# Patient Record
Sex: Male | Born: 1950 | Race: White | Hispanic: No | Marital: Married | Smoking: Former smoker
Health system: Southern US, Community
[De-identification: ages and names within clinical notes are randomized; demographics above are authoritative.]

## PROBLEM LIST (undated history)

## (undated) DIAGNOSIS — K219 Gastro-esophageal reflux disease without esophagitis: Secondary | ICD-10-CM

## (undated) DIAGNOSIS — I2699 Other pulmonary embolism without acute cor pulmonale: Secondary | ICD-10-CM

## (undated) DIAGNOSIS — I10 Essential (primary) hypertension: Secondary | ICD-10-CM

## (undated) DIAGNOSIS — N189 Chronic kidney disease, unspecified: Secondary | ICD-10-CM

## (undated) DIAGNOSIS — E785 Hyperlipidemia, unspecified: Secondary | ICD-10-CM

## (undated) DIAGNOSIS — R51 Headache: Secondary | ICD-10-CM

## (undated) DIAGNOSIS — G8929 Other chronic pain: Secondary | ICD-10-CM

## (undated) DIAGNOSIS — G43909 Migraine, unspecified, not intractable, without status migrainosus: Secondary | ICD-10-CM

## (undated) DIAGNOSIS — B019 Varicella without complication: Secondary | ICD-10-CM

## (undated) DIAGNOSIS — M542 Cervicalgia: Secondary | ICD-10-CM

## (undated) DIAGNOSIS — T7840XA Allergy, unspecified, initial encounter: Secondary | ICD-10-CM

## (undated) HISTORY — DX: Headache: R51

## (undated) HISTORY — DX: Essential (primary) hypertension: I10

## (undated) HISTORY — DX: Gastro-esophageal reflux disease without esophagitis: K21.9

## (undated) HISTORY — DX: Other chronic pain: G89.29

## (undated) HISTORY — DX: Allergy, unspecified, initial encounter: T78.40XA

## (undated) HISTORY — DX: Chronic kidney disease, unspecified: N18.9

## (undated) HISTORY — DX: Migraine, unspecified, not intractable, without status migrainosus: G43.909

## (undated) HISTORY — DX: Cervicalgia: M54.2

## (undated) HISTORY — DX: Varicella without complication: B01.9

## (undated) HISTORY — DX: Other pulmonary embolism without acute cor pulmonale: I26.99

## (undated) HISTORY — DX: Hyperlipidemia, unspecified: E78.5

## (undated) HISTORY — PX: OTHER SURGICAL HISTORY: SHX169

---

## 1980-04-24 HISTORY — PX: SPINE SURGERY: SHX786

## 2008-04-24 HISTORY — PX: CARDIAC CATHETERIZATION: SHX172

## 2008-11-24 ENCOUNTER — Observation Stay (HOSPITAL_COMMUNITY): Admission: EM | Admit: 2008-11-24 | Discharge: 2008-11-26 | Payer: Self-pay | Admitting: Emergency Medicine

## 2009-04-24 DIAGNOSIS — I2699 Other pulmonary embolism without acute cor pulmonale: Secondary | ICD-10-CM

## 2009-04-24 HISTORY — DX: Other pulmonary embolism without acute cor pulmonale: I26.99

## 2009-08-17 ENCOUNTER — Emergency Department (HOSPITAL_COMMUNITY): Admission: EM | Admit: 2009-08-17 | Discharge: 2009-08-17 | Payer: Self-pay | Admitting: Emergency Medicine

## 2009-08-18 ENCOUNTER — Ambulatory Visit: Payer: Self-pay | Admitting: Internal Medicine

## 2009-08-18 ENCOUNTER — Telehealth: Payer: Self-pay | Admitting: Internal Medicine

## 2009-08-18 DIAGNOSIS — K219 Gastro-esophageal reflux disease without esophagitis: Secondary | ICD-10-CM

## 2009-08-18 DIAGNOSIS — R079 Chest pain, unspecified: Secondary | ICD-10-CM

## 2009-08-19 ENCOUNTER — Encounter: Payer: Self-pay | Admitting: Internal Medicine

## 2009-08-20 ENCOUNTER — Ambulatory Visit: Payer: Self-pay | Admitting: Vascular Surgery

## 2009-08-20 ENCOUNTER — Observation Stay (HOSPITAL_COMMUNITY): Admission: EM | Admit: 2009-08-20 | Discharge: 2009-08-22 | Payer: Self-pay | Admitting: Emergency Medicine

## 2009-08-20 ENCOUNTER — Ambulatory Visit: Payer: Self-pay | Admitting: Critical Care Medicine

## 2009-08-20 ENCOUNTER — Telehealth (INDEPENDENT_AMBULATORY_CARE_PROVIDER_SITE_OTHER): Payer: Self-pay | Admitting: *Deleted

## 2009-08-20 ENCOUNTER — Encounter: Admission: RE | Admit: 2009-08-20 | Discharge: 2009-08-20 | Payer: Self-pay | Admitting: Internal Medicine

## 2009-08-23 ENCOUNTER — Encounter (INDEPENDENT_AMBULATORY_CARE_PROVIDER_SITE_OTHER): Payer: Self-pay | Admitting: *Deleted

## 2009-08-23 ENCOUNTER — Encounter: Admission: RE | Admit: 2009-08-23 | Discharge: 2009-08-23 | Payer: Self-pay | Admitting: Internal Medicine

## 2009-09-06 ENCOUNTER — Telehealth: Payer: Self-pay | Admitting: Internal Medicine

## 2009-09-17 ENCOUNTER — Telehealth (INDEPENDENT_AMBULATORY_CARE_PROVIDER_SITE_OTHER): Payer: Self-pay | Admitting: *Deleted

## 2009-10-19 ENCOUNTER — Telehealth: Payer: Self-pay | Admitting: Internal Medicine

## 2010-05-15 ENCOUNTER — Encounter: Payer: Self-pay | Admitting: Internal Medicine

## 2010-05-16 ENCOUNTER — Encounter
Admission: RE | Admit: 2010-05-16 | Discharge: 2010-05-16 | Payer: Self-pay | Source: Home / Self Care | Attending: Cardiovascular Disease | Admitting: Cardiovascular Disease

## 2010-05-24 NOTE — Letter (Signed)
Summary: [facility] Instructions  [facility]  [address] [place], [place] [address]   Phone: [phone]  Fax: [phone]       Timothy George    1950/07/06    MRN: [id number]        Procedure Day /Date:TUESDAY 09/21/09     Arrival Time:1:00 PM     Procedure Time:2:00 PM     Location of Procedure:                    X  [facility] (4th Floor)                        PREPARATION FOR COLONOSCOPY WITH MOVIPREP/ENDO   Starting 5 days prior to your procedure 05-26-2011do not eat nuts, seeds, popcorn, corn, beans, peas,  salads, or any raw vegetables.  Do not take any fiber supplements (e.g. Metamucil, Citrucel, and Benefiber).  THE DAY BEFORE YOUR PROCEDURE         DATE: 09-20-2009  DAY: Monday  1.  Drink clear liquids the entire day-NO SOLID FOOD  2.  Do not drink anything colored red or purple.  Avoid juices with pulp.  No orange juice.  3.  Drink at least 64 oz. (8 glasses) of fluid/clear liquids during the day to prevent dehydration and help the prep work efficiently.  CLEAR LIQUIDS INCLUDE: Water Jello Ice Popsicles Tea (sugar ok, no milk/cream) Powdered fruit flavored drinks Coffee (sugar ok, no milk/cream) Gatorade Juice: apple, white grape, white cranberry  Lemonade Clear bullion, consomm, broth Carbonated beverages (any kind) Strained chicken noodle soup Hard Candy                             4.  In the morning, mix first dose of MoviPrep solution:    Empty 1 Pouch A and 1 Pouch B into the disposable container    Add lukewarm drinking water to the top line of the container. Mix to dissolve    Refrigerate (mixed solution should be used within 24 hrs)  5.  Begin drinking the prep at 5:00 p.m. The MoviPrep container is divided by 4 marks.   Every 15 minutes drink the solution down to the next mark (approximately 8 oz) until the full liter is complete.   6.  Follow completed prep with 16 oz of clear liquid of your choice  (Nothing red or purple).  Continue to drink clear liquids until bedtime.  7.  Before going to bed, mix second dose of MoviPrep solution:    Empty 1 Pouch A and 1 Pouch B into the disposable container    Add lukewarm drinking water to the top line of the container. Mix to dissolve    Refrigerate  THE DAY OF YOUR PROCEDURE      DATE: 5/31/11DAY:TUESDAY  Beginning at 9:00 a.m. (5 hours before procedure):         1. Every 15 minutes, drink the solution down to the next mark (approx 8 oz) until the full liter is complete.  2. Follow completed prep with 16 oz. of clear liquid of your choice.    3. You may drink clear liquids until 12 NOON(2 HOURS BEFORE PROCEDURE).   MEDICATION INSTRUCTIONS  Unless otherwise instructed, you should take regular prescription medications with a small sip of water   as early as possible the morning of your procedure.  OTHER INSTRUCTIONS  You will need a responsible adult at least 59 years of age to accompany you and drive you home.   This person must remain in the waiting room during your procedure.  Wear loose fitting clothing that is easily removed.  Leave jewelry and other valuables at home.  However, you may wish to bring a book to read or  an iPod/MP3 player to listen to music as you wait for your procedure to start.  Remove all body piercing jewelry and leave at home.  Total time from sign-in until discharge is approximately 2-3 hours.  You should go home directly after your procedure and rest.  You can resume normal activities the  day after your procedure.  The day of your procedure you should not:   Drive   Make legal decisions   Operate machinery   Drink alcohol   Return to work  You will receive specific instructions about eating, activities and medications before you leave.    The above instructions have been reviewed and explained to me by   _______________________    I fully understand and can verbalize  these instructions _____________________________ Date _________

## 2010-05-24 NOTE — Miscellaneous (Signed)
Summary: discharge summary    NAME:  Timothy George, Timothy George            ACCOUNT NO.:  [id number]      MEDICAL RECORD NO.:  [id number]          PATIENT TYPE:  INP      LOCATION:  3703                         FACILITY:  [facility]      PHYSICIAN:  Ricki Rodriguez, M.D.  DATE OF BIRTH:  1951-03-08      DATE OF ADMISSION:  08/20/2009   DATE OF DISCHARGE:  08/22/2009                                  DISCHARGE SUMMARY      FINAL DIAGNOSES:   1. Small pulmonary embolism.   2. Chest pain.   3. Anxiety.   4. Hypertension.   5. Gastroesophageal reflux disease.      DISCHARGE MEDICATIONS:   1. Enoxaparin 80 mg subcutaneously twice daily.   2. Nitroglycerin 0.4 mg tablet 1 sublingual every 5 minutes x3 as       needed for chest pain.   3. Potassium chloride 10 mEq daily.   4. Warfarin 5 mg tablet 1-1/2 tablet daily in the evening.   5. Lisinopril/hydrochlorothiazide 20/12.5 mg 1 daily.   6. Metoprolol tartrate 50 mg daily.   7. Multivitamin daily.   8. Nexium 40 mg daily.   9. Xanax 0.25 mg 1 daily.      DISCHARGE DIET:  Low-sodium, heart-healthy diet.  The patient is to   avoid green leafy vegetables including broccoli and brussels sprouts.      DISCHARGE ACTIVITY:  The patient is to increase activity as tolerated.      FOLLOWUP:  By Dr. Orpah Cobb in 4 days and get outpatient PT/INR in 4   days.      CONDITION ON DISCHARGE:  Improved.      HISTORY:  This 60 year old white male presented with chest pain.  He had   undergone a CT of the chest earlier in the day showing small pulmonary   embolism.  His recent cardiac workup was unremarkable.      PHYSICAL EXAMINATION:  GENERAL:  The patient is averagely built and   nourished white male in no acute distress.   VITAL SIGNS:  Temperature 97.9, pulse 78, respirations 18, blood   pressure 134/83, and oxygen saturation 95% on room air.   HEENT:  The patient is normocephalic and atraumatic with blue eyes.   Conjunctivae pink.  Sclerae  nonicteric.   NECK:  No JVD.   LUNGS:  Clear bilaterally.   HEART:  Normal S1 and S2.   ABDOMEN:  Soft and nontender.   EXTREMITIES:  No edema, cyanosis, or clubbing.   CNS:  The patient moves all 4 extremities.      LABORATORY DATA:  Normal hemoglobin, hematocrit, WBC count, and platelet   count.  Normal electrolytes, BUN, and creatinine.  Normal CK-MB and   troponin I.  INR 1.1.      HOSPITAL COURSE:  The patient was placed in observation.  He was started   on Lovenox injection and on Coumadin.  The patient's wife had experience   in giving injections and the patient chose to have outpatient treatment,   hence he was  discharged home in satisfactory condition with Lovenox   injection prescription and Coumadin prescription.  His INR will be   checked on outpatient basis at our office and he will continue Coumadin   for 3-6 months with INR goal of 2.5-3.5.  His lower extremity Doppler   was negative for DVT.               Ricki Rodriguez, M.D.            ASK/MEDQ  D:  08/22/2009  T:  08/23/2009  Job:  [id number]      Electronically Signed by Orpah Cobb M.D. on 08/30/2009 08:46:33 AM    NAME:  Timothy George, Timothy George            ACCOUNT NO.:  [id number]      MEDICAL RECORD NO.:  [id number]          PATIENT TYPE:  INP      LOCATION:  3703                         FACILITY:  [facility]      PHYSICIAN:  Ricki Rodriguez, M.D.  DATE OF BIRTH:  1950/07/19      DATE OF ADMISSION:  08/20/2009   DATE OF DISCHARGE:  08/22/2009                                  DISCHARGE SUMMARY      FINAL DIAGNOSES:   1. Small pulmonary embolism.   2. Chest pain.   3. Anxiety.   4. Hypertension.   5. Gastroesophageal reflux disease.      DISCHARGE MEDICATIONS:   1. Enoxaparin 80 mg subcutaneously twice daily.   2. Nitroglycerin 0.4 mg tablet 1 sublingual every 5 minutes x3 as       needed for chest pain.   3. Potassium chloride 10 mEq daily.   4. Warfarin 5 mg tablet 1-1/2 tablet daily in the evening.   5.  Lisinopril/hydrochlorothiazide 20/12.5 mg 1 daily.   6. Metoprolol tartrate 50 mg daily.   7. Multivitamin daily.   8. Nexium 40 mg daily.   9. Xanax 0.25 mg 1 daily.      DISCHARGE DIET:  Low-sodium, heart-healthy diet.  The patient is to   avoid green leafy vegetables including broccoli and brussels sprouts.      DISCHARGE ACTIVITY:  The patient is to increase activity as tolerated.      FOLLOWUP:  By Dr. Orpah Cobb in 4 days and get outpatient PT/INR in 4   days.      CONDITION ON DISCHARGE:  Improved.      HISTORY:  This 60 year old white male presented with chest pain.  He had   undergone a CT of the chest earlier in the day showing small pulmonary   embolism.  His recent cardiac workup was unremarkable.      PHYSICAL EXAMINATION:  GENERAL:  The patient is averagely built and   nourished white male in no acute distress.   VITAL SIGNS:  Temperature 97.9, pulse 78, respirations 18, blood   pressure 134/83, and oxygen saturation 95% on room air.   HEENT:  The patient is normocephalic and atraumatic with blue eyes.   Conjunctivae pink.  Sclerae nonicteric.   NECK:  No JVD.   LUNGS:  Clear bilaterally.   HEART:  Normal S1 and S2.   ABDOMEN:  Soft and nontender.   EXTREMITIES:  No edema, cyanosis, or clubbing.   CNS:  The patient moves all 4 extremities.      LABORATORY DATA:  Normal hemoglobin, hematocrit, WBC count, and platelet   count.  Normal electrolytes, BUN, and creatinine.  Normal CK-MB and   troponin I.  INR 1.1.      HOSPITAL COURSE:  The patient was placed in observation.  He was started   on Lovenox injection and on Coumadin.  The patient's wife had experience   in giving injections and the patient chose to have outpatient treatment,   hence he was discharged home in satisfactory condition with Lovenox   injection prescription and Coumadin prescription.  His INR will be   checked on outpatient basis at our office and he will continue Coumadin   for 3-6 months  with INR goal of 2.5-3.5.  His lower extremity Doppler   was negative for DVT.               Ricki Rodriguez, M.D.            ASK/MEDQ  D:  08/22/2009  T:  08/23/2009  Job:  [id number]      Electronically Signed by Orpah Cobb M.D. on 08/30/2009 08:46:33 AM    NAME:  Timothy George, Timothy George            ACCOUNT NO.:  [id number]      MEDICAL RECORD NO.:  [id number]          PATIENT TYPE:  INP      LOCATION:  3703                         FACILITY:  [facility]      PHYSICIAN:  Ricki Rodriguez, M.D.  DATE OF BIRTH:  [birth year]      DATE OF ADMISSION:  08/20/2009   DATE OF DISCHARGE:  08/22/2009                                  DISCHARGE SUMMARY      FINAL DIAGNOSES:   1. Small pulmonary embolism.   2. Chest pain.   3. Anxiety.   4. Hypertension.   5. Gastroesophageal reflux disease.      DISCHARGE MEDICATIONS:   1. Enoxaparin 80 mg subcutaneously twice daily.   2. Nitroglycerin 0.4 mg tablet 1 sublingual every 5 minutes x3 as       needed for chest pain.   3. Potassium chloride 10 mEq daily.   4. Warfarin 5 mg tablet 1-1/2 tablet daily in the evening.   5. Lisinopril/hydrochlorothiazide 20/12.5 mg 1 daily.   6. Metoprolol tartrate 50 mg daily.   7. Multivitamin daily.   8. Nexium 40 mg daily.   9. Xanax 0.25 mg 1 daily.      DISCHARGE DIET:  Low-sodium, heart-healthy diet.  The patient is to   avoid green leafy vegetables including broccoli and brussels sprouts.      DISCHARGE ACTIVITY:  The patient is to increase activity as tolerated.      FOLLOWUP:  By Dr. Orpah Cobb in 4 days and get outpatient PT/INR in 4   days.      CONDITION ON DISCHARGE:  Improved.      HISTORY:  This 60 year old white male presented with chest pain.  He had   undergone a CT of the chest earlier  in the day showing small pulmonary   embolism.  His recent cardiac workup was unremarkable.      PHYSICAL EXAMINATION:  GENERAL:  The patient is averagely built and   nourished white male in no acute distress.     VITAL SIGNS:  Temperature 97.9, pulse 78, respirations 18, blood   pressure 134/83, and oxygen saturation 95% on room air.   HEENT:  The patient is normocephalic and atraumatic with blue eyes.   Conjunctivae pink.  Sclerae nonicteric.   NECK:  No JVD.   LUNGS:  Clear bilaterally.   HEART:  Normal S1 and S2.   ABDOMEN:  Soft and nontender.   EXTREMITIES:  No edema, cyanosis, or clubbing.   CNS:  The patient moves all 4 extremities.      LABORATORY DATA:  Normal hemoglobin, hematocrit, WBC count, and platelet   count.  Normal electrolytes, BUN, and creatinine.  Normal CK-MB and   troponin I.  INR 1.1.      HOSPITAL COURSE:  The patient was placed in observation.  He was started   on Lovenox injection and on Coumadin.  The patient's wife had experience   in giving injections and the patient chose to have outpatient treatment,   hence he was discharged home in satisfactory condition with Lovenox   injection prescription and Coumadin prescription.  His INR will be   checked on outpatient basis at our office and he will continue Coumadin   for 3-6 months with INR goal of 2.5-3.5.  His lower extremity Doppler   was negative for DVT.               Ricki Rodriguez, M.D.            ASK/MEDQ  D:  08/22/2009  T:  08/23/2009  Job:  [id number]      Electronically Signed by Orpah Cobb M.D. on 08/30/2009 08:46:33 AM

## 2010-05-24 NOTE — Progress Notes (Signed)
Summary: BT's  Phone Note Call from Patient Call back at Work Phone [phone]   Caller: Patient Call For: [name] Reason for Call: Talk to Nurse Summary of Call: has questions regarding blood thinners in regards to procedure coming up on 31st Initial call taken by: Vallarie Mare,  Sep 06, 2009 2:44 PM  Follow-up for Phone Call        Will put info . in EMR for DR.Perry's review and call pt. back. Follow-up by: Teryl Lucy RN,  Sep 06, 2009 3:10 PM     Appended Document: BT's he is now on Coumadin for pulmonary embolus. In terms of his GI procedural work, I would not plan anything at this time. Have him come see me in the office in about 3 months.  Appended Document: BT's Pt. notified of Dr.Perry's recommendations and procedure for 09/21/09 cx.

## 2010-05-24 NOTE — Progress Notes (Signed)
Summary: NURSE QUESTION  Phone Note Call from Patient Call back at Work Phone [phone]   Caller: Patient Call For: Timothy George Reason for Call: Talk to Nurse Summary of Call: PT CALLED ABOUT PROC ON 10/21/2009 IS ON COUMADIN AND IS WAITING ON CALL BACK ABOUT WHAT TO DO ABOUT PROC ,WAS TOLD  YOU WOULD GET BACK TO HIM BUT NO ONE HAS CALLED. PLEASE GIVE HIM A CALL @[phone]. Initial call taken by: Eugene Garnet,  October 19, 2009 9:42 AM  Follow-up for Phone Call        Procedure for 10/21/09 was cx. by Elita Quick per order of Dr.Perry. pt. aware he needs to make rov appt. August 2011. Follow-up by: Teryl Lucy RN,  October 19, 2009 10:20 AM

## 2010-05-24 NOTE — Miscellaneous (Signed)
Summary: Orders Update-CT Chest  Clinical Lists Changes  Orders: Added new Test order of CT Chest (CT Chest) - Signed Added new Test order of Ultrasound Abdomen (UAS) - Signed

## 2010-05-24 NOTE — Progress Notes (Signed)
Summary: CT scan canceled  Phone Note Call from Patient Call back at Work Phone [phone]   Caller: Patient Call For: [name] Reason for Call: Talk to Nurse Summary of Call: pt canceled his CT scan for tomorrow after finding out that the facility is out of network... pt will be contacting his insurance to obtain a list of in network facilities, he will then contact our office to ask for a referral... before the end of the day today, pt wants to know if he still needs to have labwork tomorrow or was the labwork only for the CT scan and he can cancel these labs as well Initial call taken by: Vallarie Mare,  August 18, 2009 3:42 PM  Follow-up for Phone Call        laboratory necessary.. Had multiple labs yesterday in the ER. As for CT of the chest, he can let us know when he identifies a facility that accepts his insurance. Follow-up by: Hilarie Fredrickson MD,  August 18, 2009 3:51 PM  Additional Follow-up for Phone Call Additional follow up Details #1::        Called patient and he wants to cancel CT and Ultrasound  He will call insurance co. to see where he can get these done in network.  He will call me back with information.  CT and Abdominal ultrasound was canceled. Milford Cage [facility]  August 18, 2009 3:58 PM

## 2010-05-24 NOTE — Progress Notes (Signed)
Summary: NEEDS PULMONARY EVAL FOR NEW PE  Phone Note Other Incoming   Summary of Call: Dr.Mansell discussed CT results with Dr.Perry XN:[id number] embolus found in both lower lobes.Pulmoary consult needed today Per pulmonary.DrRamaswami will be in office at 1:30 pm but has no openings so Dr.Perry will have to do a DR. to Dr. call to [phone] when he comes in. Initial call taken by: Teryl Lucy RN,  August 20, 2009 12:05 PM  Follow-up for Phone Call        I SPOKE TO DR [facility] OF PULMONARY. HE WILL CALL THE PATIENT TO ARRANGE FOR ADMISSION Follow-up by: Hilarie Fredrickson MD,  August 20, 2009 12:12 PM  Additional Follow-up for Phone Call Additional follow up Details #1::        Message left on pt's home # and cell phone#[phone]) to please call to notify of CT results and that Dr.Clance will be calling  him to admit to hospital. Additional Follow-up by: Teryl Lucy RN,  August 20, 2009 12:22 PM    Additional Follow-up for Phone Call Additional follow up Details #2::    Above MD orders reviewed with patient. Pt. call has been transferred to [facility] in Pulmonary, she will connect pt. with Dr.Clance for admission. Follow-up by: Laureen Ochs LPN,  August 20, 2009 3:21 PM   Appended Document: NEEDS PULMONARY EVAL FOR NEW PE have spoken with hospital based NP, and will arrange for admission and treatment of PE.  One of our hospital based md's will see the pt there.    Appended Document: NEEDS PULMONARY EVAL FOR NEW PE Pt was informed about CT angiogram results over the phone, and advised to go directly to the emergency room for hospital admission.  The pt's primary care physician is Dr. Orpah Cobb.  I have contacted Dr. Algie Coffer informing him of CT findings.  He has agreed to admit patient to hospital.  We have contacted the emergency room, and informed them to notify Dr. Roseanne Kaufman service when Timothy George arrives in the emergency room.

## 2010-05-24 NOTE — Progress Notes (Signed)
Summary: Cancelled COLON/EGD for 5-31  Phone Note Outgoing Call   Call placed by: Joselyn Glassman,  Sep 17, 2009 1:10 PM Call placed to: Patient Summary of Call: Per [name], I called pt and spoke to his wife. Due to a scheduled error on our part, I told her we would have to cancel the Colonoscopy/EGD scheduled for 09-21-09 and we would have to reschedule.  She told me he is on coumadin under [name] management. The pt did say that he talked to [facility] and he was told due to his Pulmonary Embolus , [name] said he will not do any procedures at this time.  He should make an appt to see [name] in 3 months. Initial call taken by: Joselyn Glassman,  Sep 17, 2009 1:13 PM

## 2010-05-24 NOTE — Assessment & Plan Note (Signed)
Summary: CHEST PAIN   History of Present Illness Visit Type: new patient  Primary GI MD: Yancey Flemings MD Primary Provider: Orpah Cobb, MD  Requesting Provider: n/a Chief Complaint: Generalized abd pain, chest pain, acid reflux, heartburn, and diarrhea History of Present Illness:   60 year old white male with hypertension, nonobstructive coronary artery disease, sleep apnea, and anxiety disorder. He is self-referred regarding problems with recurrent chest pain. Patient has had problems with chest pain intermittently for 10 years. He was evaluated in [place] initially. At one point, he was told that he had GERD and was placed on PPI therapy. He has been on either pantoprazole or Nexium daily since that time. He moved to [place] slightly less than one year ago due to job relocation. Last August, he had severe chest pain and was admitted to [facility]. At that time, he ruled out for myocardial infarction. Cardiac catheterization was performed and revealed nonobstructive coronary artery disease that was not felt to be clinically relevant. Since that time he has had some minor problems with chest pain until last Thursday when he developed severe pain that worsened throughout the course of the day. He eventually ended up a hospital in [place] where he was camping. The workup was negative. They wanted to admit him but he refused and signed out AMA. He has since had intermittent chest pain which was severe yesterday and resulted in his presenting to the [facility] ER. Workup (reviewed) was negative. He had negative cardiac enzymes. Normal CBC and comprehensive metabolic panel. He was treated with GI cocktail which he states helped significantly. He is anxious. He describes the pain as occurring across the entire top half of his chest. It can be either sharp, tight, or achy. It generally lasts 15 minutes but can last for hours. Occasional radiation to the back. He feels that the pain can be brought on  by stress. Xanax does not help. He generally has no pain in the morning but rather toward days end. He exercises regularly. He states that he feels good and he is on a treadmill and will not get chest pain. He is lost 25 pounds over the past 3 months due to combination of exercise and diet. He takes antacids may help his chest pain.    GI Review of Systems    Reports abdominal pain, acid reflux, chest pain, and  heartburn.     Location of  Abdominal pain: generalized.    Denies belching, bloating, dysphagia with liquids, dysphagia with solids, loss of appetite, nausea, vomiting, vomiting blood, weight loss, and  weight gain.      Reports diarrhea.     Denies anal fissure, black tarry stools, change in bowel habit, constipation, diverticulosis, fecal incontinence, heme positive stool, hemorrhoids, irritable bowel syndrome, jaundice, light color stool, liver problems, rectal bleeding, and  rectal pain.    Current Medications (verified): 1)  Lisinopril-Hydrochlorothiazide 20-12.5 Mg Tabs (Lisinopril-Hydrochlorothiazide) .Marland Kitchen.. 1 By Mouth Once Daily 2)  Metoprolol Tartrate 50 Mg Tabs (Metoprolol Tartrate) .Marland Kitchen.. 1 By Mouth Two Times A Day 3)  Nexium 40 Mg Cpdr (Esomeprazole Magnesium) .Marland Kitchen.. 1 By Mouth Once Daily 4)  Xanax 0.25 Mg Tabs (Alprazolam) .Marland Kitchen.. 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  Alcohol Use - yes: Occ on weekends  Daily Caffeine Use: once daily  Illicit Drug Use - no Smoking Status:  quit Drug Use:  no  Review of Systems       The patient complains of sleeping problems and anxiety-new.  The patient denies allergy/sinus, anemia,  arthritis/joint pain, back pain, blood in urine, breast changes/lumps, change in vision, confusion, cough, coughing up blood, depression-new, fainting, fatigue, fever, headaches-new, hearing problems, heart murmur, heart rhythm changes, itching, muscle pains/cramps, night sweats, nosebleeds, shortness of breath, skin rash, sore throat, swelling of feet/legs, swollen lymph glands, thirst - excessive, urination - excessive, urination changes/pain, urine leakage, vision changes, and voice change.    Vital Signs:  Patient profile:   60 year old male Height:      66 inches Weight:      175 pounds BMI:     28.35 BSA:     1.89 Pulse rate:   76 / minute Pulse rhythm:   regular BP sitting:   138 / 82  (left arm) Cuff size:   regular  Vitals Entered By: Ok Anis CMA (August 18, 2009 11:09 AM)  Physical Exam  General:  Well developed, well nourished, no acute distress. Head:  Normocephalic and atraumatic. Eyes:  PERRLA, no icterus. Ears:  Normal auditory acuity. Nose:  No deformity, discharge,  or lesions. Mouth:  No deformity or lesions, dentition normal. Neck:  Supple; no masses or thyromegaly. Chest Wall:  Symmetrical,  no deformities .Marland Kitchen No reproducible tenderness. Prominent xiphoid process. Breasts:  no masses, tenderness or gynecomastia noted. Lungs:  Clear throughout to auscultation. Heart:  Regular rate and rhythm; no murmurs, rubs,  or bruits. Abdomen:  Soft, nontender and nondistended. No masses, hepatosplenomegaly or hernias noted. Normal bowel sounds. Rectal:  deferred Prostate:  deferred Msk:  Symmetrical with no gross deformities. Normal posture. Pulses:  Normal pulses noted. Extremities:  No clubbing, cyanosis, edema or deformities noted. Neurologic:  Alert and  oriented x4;  grossly normal neurologically. Skin:  Intact without significant lesions or rashes. Psych:  Alert and cooperative. Normal mood and affect.   Impression & Recommendations:  Problem # 1:  CHEST PAIN  (ICD-786.50) Chronic recurrent chest pain. Severe at times. Negative inpatient workup, including cardiac catheterization August 2010. Recent trips to the emergency room as described. Possible etiologies include musculoskeletal pain, breakthrough reflux, or functional pain. Other less likely causes, given the chronicity and lack of associated features, include structural lesions.  Plan: #1. Increase Nexium to b.i.d. #2. Scheduled on ultrasound rule out gallstones as a cause for recurrent chest pain #3. Schedule CT of the chest to rule out mass lesion, pericardial disease, chronic aortic dissection, or less likely chronic PE #4. If the above negative, a diagnostic upper endoscopy. The nature of the procedure as well as the risks, benefits, and alternatives have been reviewed. He understood and agreed to proceed  Problem # 2:  GERD (ICD-530.81) may have GERD. Not clear however. Not clear if this is playing a role in his chest pain.  Plan: #1. Increase Nexium to b.i.d. for now #2. Antacids p.r.n. chest pain to assess for response  Problem # 3:  SPECIAL SCREENING FOR MALIGNANT NEOPLASMS COLON (ICD-V76.51) the patient is at baseline risk for colorectal neoplasia. He is an appropriate candidate for colon cancer screening in the form of screening colonoscopy. He is interested in such. He would like to have this done concurrent with his upper endoscopy. There are no  contraindications.  Plan: #1. Colonoscopy. The nature of the procedure as well as the risks, benefits, and alternatives were reviewed. He understood and agreed to proceed #2. Movi prep prescribed. The patient instructed on its use  Other Orders: Colon/Endo (Colon/Endo) Ultrasound Abdomen (UAS) CT Chest (CT Chest)  Patient Instructions: 1)  Abdominal Ultrasound [facility] 08/19/09 9:00 arrive at 8:45 am 2)  We will call you with CT Scan appt.  3)  Contrast given to patient in office. 4)  Colon/Endo 09/21/09 2:00 pm arrive at 1:00 pm 5)   Movi prep instructions given to patient. 6)  Movi prep Rx. sent to pharmacy for you to pick up. 7)  Colonoscopy and Flexible Sigmoidoscopy brochure given.  8)  Upper Endoscopy brochure given.  9)  Increase Nexium to two times a day  10)  The medication list was reviewed and reconciled.  All changed / newly prescribed medications were explained.  A complete medication list was provided to the patient / caregiver. 11)  printed and given to patient. Milford Cage [facility]  August 18, 2009 12:15 PM 12)  Copy: Dr. Orpah Cobb

## 2010-07-12 LAB — CK TOTAL AND CKMB (NOT AT ARMC)
CK, MB: 1.6 ng/mL (ref 0.3–4.0)
CK, MB: 2.4 ng/mL (ref 0.3–4.0)
Relative Index: INVALID (ref 0.0–2.5)
Relative Index: INVALID (ref 0.0–2.5)
Total CK: 91 U/L (ref 7–232)
Total CK: 94 U/L (ref 7–232)

## 2010-07-12 LAB — DIFFERENTIAL
Basophils Relative: 0 % (ref 0–1)
Basophils Relative: 0 % (ref 0–1)
Eosinophils Relative: 2 % (ref 0–5)
Lymphs Abs: 1.5 10*3/uL (ref 0.7–4.0)
Neutro Abs: 3.8 10*3/uL (ref 1.7–7.7)
Neutrophils Relative %: 63 % (ref 43–77)
Neutrophils Relative %: 77 % (ref 43–77)

## 2010-07-12 LAB — BASIC METABOLIC PANEL
BUN: 8 mg/dL (ref 6–23)
CO2: 26 mEq/L (ref 19–32)
Creatinine, Ser: 0.78 mg/dL (ref 0.4–1.5)
GFR calc Af Amer: >60 mL/min (ref >60)
Sodium: 138 mEq/L (ref 135–145)

## 2010-07-12 LAB — COMPREHENSIVE METABOLIC PANEL
AST: 21 U/L (ref 0–37)
BUN: 12 mg/dL (ref 6–23)
CO2: 28 mEq/L (ref 19–32)
Calcium: 9.1 mg/dL (ref 8.4–10.5)
Chloride: 103 mEq/L (ref 96–112)
Creatinine, Ser: 0.85 mg/dL (ref 0.4–1.5)
GFR calc non Af Amer: >60 mL/min (ref >60)
Potassium: 4.2 mEq/L (ref 3.5–5.1)
Sodium: 138 mEq/L (ref 135–145)
Total Bilirubin: 1 mg/dL (ref 0.3–1.2)
Total Protein: 6.8 g/dL (ref 6.0–8.3)

## 2010-07-12 LAB — PROTIME-INR
INR: 1.1 (ref 0.00–1.49)
INR: 1.12 (ref 0.00–1.49)
Prothrombin Time: 14.1 seconds (ref 11.6–15.2)
Prothrombin Time: 13.6 seconds (ref 11.6–15.2)
Prothrombin Time: 14.3 seconds (ref 11.6–15.2)

## 2010-07-12 LAB — TROPONIN I
Troponin I: 0.01 ng/mL (ref 0.00–0.06)
Troponin I: <0.01 ng/mL (ref 0.00–0.06)

## 2010-07-12 LAB — CBC
Hemoglobin: 14.6 g/dL (ref 13.0–17.0)
MCV: 92.7 fL (ref 78.0–100.0)
MCV: 93 fL (ref 78.0–100.0)
Platelets: 183 10*3/uL (ref 150–400)
RDW: 12.6 % (ref 11.5–15.5)
RDW: 12.9 % (ref 11.5–15.5)

## 2010-07-12 LAB — LIPID PANEL
HDL: 52 mg/dL (ref >39)
LDL Cholesterol: 75 mg/dL (ref 0–99)
VLDL: 13 mg/dL (ref 0–40)

## 2010-07-12 LAB — POCT CARDIAC MARKERS

## 2010-07-30 LAB — BASIC METABOLIC PANEL
CO2: 27 mEq/L (ref 19–32)
CO2: 28 mEq/L (ref 19–32)
Calcium: 9.2 mg/dL (ref 8.4–10.5)
Chloride: 106 mEq/L (ref 96–112)
Creatinine, Ser: 0.95 mg/dL (ref 0.4–1.5)
GFR calc Af Amer: >60 mL/min (ref >60)
GFR calc Af Amer: >60 mL/min (ref >60)
GFR calc non Af Amer: >60 mL/min (ref >60)
Glucose, Bld: 108 mg/dL — ABNORMAL HIGH (ref 70–99)
Sodium: 142 mEq/L (ref 135–145)

## 2010-07-30 LAB — DIFFERENTIAL
Basophils Absolute: 0 10*3/uL (ref 0.0–0.1)
Basophils Relative: 0 % (ref 0–1)
Monocytes Absolute: 0.6 10*3/uL (ref 0.1–1.0)
Neutro Abs: 5.8 10*3/uL (ref 1.7–7.7)
Neutrophils Relative %: 73 % (ref 43–77)

## 2010-07-30 LAB — COMPREHENSIVE METABOLIC PANEL
ALT: 15 U/L (ref 0–53)
AST: 27 U/L (ref 0–37)
Albumin: 3.9 g/dL (ref 3.5–5.2)
Alkaline Phosphatase: 62 U/L (ref 39–117)
Chloride: 107 mEq/L (ref 96–112)
GFR calc Af Amer: >60 mL/min (ref >60)
GFR calc non Af Amer: >60 mL/min (ref >60)
Glucose, Bld: 99 mg/dL (ref 70–99)
Total Bilirubin: 0.7 mg/dL (ref 0.3–1.2)
Total Protein: 6.7 g/dL (ref 6.0–8.3)

## 2010-07-30 LAB — POCT I-STAT, CHEM 8
Calcium, Ion: 1.1 mmol/L — ABNORMAL LOW (ref 1.12–1.32)
Chloride: 107 mEq/L (ref 96–112)
HCT: 44 % (ref 39.0–52.0)
Hemoglobin: 15 g/dL (ref 13.0–17.0)

## 2010-07-30 LAB — CBC
Hemoglobin: 14.9 g/dL (ref 13.0–17.0)
RDW: 12.5 % (ref 11.5–15.5)
WBC: 7.9 10*3/uL (ref 4.0–10.5)

## 2010-07-30 LAB — POCT CARDIAC MARKERS: Troponin i, poc: <0.05 ng/mL (ref 0.00–0.09)

## 2010-07-30 LAB — CK TOTAL AND CKMB (NOT AT ARMC)
CK, MB: 1.2 ng/mL (ref 0.3–4.0)
CK, MB: 1.5 ng/mL (ref 0.3–4.0)
Relative Index: INVALID (ref 0.0–2.5)
Relative Index: INVALID (ref 0.0–2.5)
Total CK: 87 U/L (ref 7–232)

## 2010-07-30 LAB — BRAIN NATRIURETIC PEPTIDE: Pro B Natriuretic peptide (BNP): <30 pg/mL (ref 0.0–100.0)

## 2010-07-30 LAB — LIPID PANEL
Cholesterol: 172 mg/dL (ref 0–200)
LDL Cholesterol: 106 mg/dL — ABNORMAL HIGH (ref 0–99)
Triglycerides: 101 mg/dL (ref <150)

## 2010-07-30 LAB — TSH: TSH: 1.06 u[IU]/mL (ref 0.350–4.500)

## 2010-07-30 LAB — TROPONIN I
Troponin I: 0.01 ng/mL (ref 0.00–0.06)
Troponin I: 0.01 ng/mL (ref 0.00–0.06)

## 2010-09-06 NOTE — Discharge Summary (Signed)
NAME:  Timothy George, Timothy George NO.:  [id number]   MEDICAL RECORD NO.:  [id number]          PATIENT TYPE:  INP   LOCATION:  3707                         FACILITY:  [facility]   PHYSICIAN:  Ricki Rodriguez, M.D.  DATE OF BIRTH:  04/08/1951   DATE OF ADMISSION:  11/24/2008  DATE OF DISCHARGE:  11/26/2008                               DISCHARGE SUMMARY   FINAL DIAGNOSES:  1. Chest pain.  2. Coronary artery disease of native coronary vessels.  3. Tobacco use disorder.  4. Hypertension.  5. Anxiety.   DISCHARGE MEDICATIONS:  1. Lisinopril/hydrochlorothiazide 20/12.5 mg 1 daily.  2. Metoprolol 50 mg 1 twice daily.  3. Nexium 1 capsule daily.  4. KCl (potassium) over 10 mEq 1 daily.  5. Simvastatin 20 mg 1 daily.  6. Xanax 0.25 mg 1 daily.   DISCHARGE DIET:  Low-sodium, heart-healthy diet.   DISCHARGE ACTIVITY:  The patient is to increase activity slowly.   WOUND CARE INSTRUCTIONS:  The patient is to notify right groin pain,  swelling, or discharge.   FOLLOWUP:  Dr. Orpah Cobb in 2-4 weeks.  The patient is to call [phone] for appointment.   HISTORY:  This 60 year old white male presented with a tightness  retrosternally like somebody sitting on him along with a history of  anxiety for 2 weeks.  The patient has risk factors of tobacco chewing,  hypertension, and family history of heart disease.   PHYSICAL EXAMINATION:  VITAL SIGNS:  Temperature 97.8, pulse 75,  respirations 18, blood pressure 130/93, height 5 feet 6 inches, weight  195 pounds.  GENERAL:  The patient is averagely built and well nourished.  HEENT:  The patient is normocephalic, atraumatic with pupils equally  reacting to light.  Extraocular movements intact.  Has blue eyes.  Conjunctivae pink.  Sclerae nonicteric.  NECK:  Supple.  No JVD.  LUNGS:  Clear bilaterally.  HEART:  Normal S1 and S2 and no murmur, gallop, or rub.  ABDOMEN:  Soft and nontender.  EXTREMITIES:  No edema, cyanosis, or  clubbing.  NEUROLOGIC:  The patient is alert and oriented x3, and cranial nerves II  through XII grossly intact.   LABORATORY DATA:  Normal hemoglobin/hematocrit, WBC count, platelet  count.  Normal electrolytes, BUN, creatinine.  Chest x-ray revealed some  atelectasis.  EKG revealed sinus rhythm with possible anterior infarct.  Cardiac catheterization showed minimal coronary artery disease and  preserved LV systolic function.   HOSPITAL COURSE:  The patient was admitted to telemetry unit.  Myocardial infarction was ruled out.  He underwent cardiac  catheterization because of his typical chest pain, which failed to show  significant coronary artery disease hence he was discharged home in  satisfactory condition with addition of simvastatin, potassium, and  alprazolam to his current medications, and he will be followed by me in  2-4 weeks with Dr. Orpah Cobb.      Ricki Rodriguez, M.D.  Electronically Signed     Ricki Rodriguez, M.D.  Electronically Signed    ASK/MEDQ  D:  11/26/2008  T:  11/26/2008  Job:  [id number]

## 2010-11-23 ENCOUNTER — Encounter: Payer: Self-pay | Admitting: Family Medicine

## 2010-11-23 ENCOUNTER — Ambulatory Visit (INDEPENDENT_AMBULATORY_CARE_PROVIDER_SITE_OTHER): Payer: BC Managed Care – PPO | Admitting: Family Medicine

## 2010-11-23 DIAGNOSIS — E785 Hyperlipidemia, unspecified: Secondary | ICD-10-CM

## 2010-11-23 DIAGNOSIS — Z86711 Personal history of pulmonary embolism: Secondary | ICD-10-CM | POA: Insufficient documentation

## 2010-11-23 DIAGNOSIS — I10 Essential (primary) hypertension: Secondary | ICD-10-CM | POA: Insufficient documentation

## 2010-11-23 DIAGNOSIS — G43909 Migraine, unspecified, not intractable, without status migrainosus: Secondary | ICD-10-CM

## 2010-11-23 DIAGNOSIS — M549 Dorsalgia, unspecified: Secondary | ICD-10-CM

## 2010-11-23 DIAGNOSIS — M546 Pain in thoracic spine: Secondary | ICD-10-CM

## 2010-11-23 MED ORDER — CYCLOBENZAPRINE HCL 10 MG PO TABS: 10.0000 mg | ORAL_TABLET | Freq: Three times a day (TID) | ORAL | Status: AC | PRN

## 2010-11-23 NOTE — Patient Instructions (Signed)
Consider baby aspirin 81 mg once daily. Continue with heat and ice to upper back as needed.

## 2010-11-23 NOTE — Progress Notes (Signed)
  Subjective:    Patient ID: Timothy George, male    DOB: 1950/07/12, 60 y.o.   MRN: [id number]  HPI New patient to establish care. Patient has past medical history of GERD, remote history of migraine headaches, kidney stones, high blood pressure, hyperlipidemia. Developed chest pain in July 2010 heart catheterization reportedly showed only minimal nonobstructive atherosclerosis. Patient developed recurrent chest pain 2011 and had pulmonary emboli. Was treated with Coumadin for several months and was taken off several months ago with no recurrent symptoms. No previous history of DVT. Remote history of a C5-C6 disc surgery 20 years ago.  Medications reviewed. He continues to see cardiologist for his med refills.  Patient is married. Works full-time. Nonsmoker. Uses smokeless tobacco. Drinks a glass of wine per day.  Acute problem of left upper back pain. One week duration. No injury. No radiculopathy symptoms. Quality is achy and symptoms are moderate. Worse with movement. Abvil helps slightly. Minimal relief with ice.   Review of Systems  Constitutional: Negative for fever, activity change, appetite change and fatigue.  HENT: Negative for ear pain, congestion and trouble swallowing.   Eyes: Negative for pain and visual disturbance.  Respiratory: Negative for cough, shortness of breath and wheezing.   Cardiovascular: Negative for chest pain and palpitations.  Gastrointestinal: Negative for nausea, vomiting, abdominal pain, diarrhea, constipation, blood in stool, abdominal distention and rectal pain.  Genitourinary: Negative for dysuria, hematuria and testicular pain.  Musculoskeletal: Positive for back pain. Negative for joint swelling and arthralgias.  Skin: Negative for rash.  Neurological: Negative for dizziness, syncope and headaches.  Hematological: Negative for adenopathy.  Psychiatric/Behavioral: Negative for confusion and dysphoric mood.       Objective:   Physical Exam    Constitutional: He is oriented to person, place, and time. He appears well-developed and well-nourished. No distress.  HENT:  Mouth/Throat: Oropharynx is clear and moist.  Neck: Neck supple. No thyromegaly present.  Cardiovascular: Normal rate, regular rhythm and normal heart sounds.   Pulmonary/Chest: Effort normal and breath sounds normal. No respiratory distress. He has no wheezes. He has no rales. He exhibits no tenderness.  Musculoskeletal:       Tender to palpation left trapezius. Full range of motion cervical spine.  Lymphadenopathy:    He has no cervical adenopathy.  Neurological: He is alert and oriented to person, place, and time.       Full-strength upper extremities  Psychiatric: He has a normal mood and affect. His behavior is normal.          Assessment & Plan:  #1 hypertension. This has been followed by cardiologist. Medications reviewed #2 reported mild hyperlipidemia. Patient not on statin. Try to get records of previous labs #3 history of GERD stable  #4 history of migraine headaches  #5 history of pulmonary emboli  #6 upper back pain, suspect muscular. Continue heat or ice. Cyclobenzaprine 10 mg each bedtime and warned about possible sedation

## 2010-12-01 ENCOUNTER — Ambulatory Visit (INDEPENDENT_AMBULATORY_CARE_PROVIDER_SITE_OTHER)
Admission: RE | Admit: 2010-12-01 | Discharge: 2010-12-01 | Disposition: A | Discharge status: Home / Self Care | Payer: BC Managed Care – PPO | Source: Ambulatory Visit | Attending: Family Medicine | Admitting: Family Medicine

## 2010-12-01 ENCOUNTER — Ambulatory Visit (INDEPENDENT_AMBULATORY_CARE_PROVIDER_SITE_OTHER): Payer: BC Managed Care – PPO | Admitting: Family Medicine

## 2010-12-01 ENCOUNTER — Encounter: Payer: Self-pay | Admitting: Family Medicine

## 2010-12-01 ENCOUNTER — Telehealth: Payer: Self-pay | Admitting: *Deleted

## 2010-12-01 VITALS — BP 120/70

## 2010-12-01 DIAGNOSIS — M5412 Radiculopathy, cervical region: Secondary | ICD-10-CM

## 2010-12-01 MED ORDER — PREDNISONE 10 MG PO TABS: ORAL_TABLET | ORAL | Status: DC

## 2010-12-01 NOTE — Telephone Encounter (Signed)
Pt would like xray results left on his answering machine at home.  It will not be a personalized voice mail.

## 2010-12-01 NOTE — Progress Notes (Signed)
  Subjective:    Patient ID: Timothy George, male    DOB: 03-12-1951, 60 y.o.   MRN: [id number]  HPI Progressive neck pain and upper back pain. Refer to prior note. Now has some left radiculopathy symptoms. Mostly tingling sensation and numbness left upper extremity with some pain as well. Pain is severe at times and neck and upper back. Quality is aching.   Started muscle relaxer which has helped sleep but has not helped his pain much. He is taking ibuprofen without relief. Previous C5-C6 surgery as previously noted   Review of Systems  HENT: Positive for neck pain. Negative for neck stiffness.   Respiratory: Negative for cough and shortness of breath.   Cardiovascular: Negative for chest pain.  Neurological: Positive for numbness. Negative for weakness and headaches.       Objective:   Physical Exam  Constitutional: He appears well-developed and well-nourished.  Cardiovascular: Normal rate and regular rhythm.   Pulmonary/Chest: Effort normal and breath sounds normal. No respiratory distress. He has no wheezes. He has no rales.  Musculoskeletal:       Patient has some tenderness left lower cervical region. Good range of motion but pain with lateral bending and rotation to either side. No muscle atrophy  Neurological:       Symmetric upper extremity reflexes.  Sensation intact to touch          Assessment & Plan:  Progressive left neck pain in a patient now with radiculopathy symptoms and prior history of C5-C6 surgery. prednisone taper. Obtain cervical spine films. May need MRI to further assess

## 2010-12-02 NOTE — Progress Notes (Signed)
Quick Note:  Pt informed, He feels 99% better. He will be in touch prn FYI ______

## 2010-12-12 ENCOUNTER — Telehealth: Payer: Self-pay | Admitting: Family Medicine

## 2010-12-12 NOTE — Telephone Encounter (Signed)
Please advise 

## 2010-12-12 NOTE — Telephone Encounter (Signed)
Pt has pinched nerve in neck that he has re-aggravated today. Pt req work in [employer].

## 2010-12-13 ENCOUNTER — Ambulatory Visit (INDEPENDENT_AMBULATORY_CARE_PROVIDER_SITE_OTHER): Payer: BC Managed Care – PPO | Admitting: Family Medicine

## 2010-12-13 ENCOUNTER — Encounter: Payer: Self-pay | Admitting: Family Medicine

## 2010-12-13 VITALS — BP 150/110 | Temp 98.5°F | Wt 186.0 lb

## 2010-12-13 DIAGNOSIS — M5412 Radiculopathy, cervical region: Secondary | ICD-10-CM

## 2010-12-13 DIAGNOSIS — M542 Cervicalgia: Secondary | ICD-10-CM

## 2010-12-13 MED ORDER — HYDROCODONE-ACETAMINOPHEN 5-325 MG PO TABS: 2.0000 | ORAL_TABLET | Freq: Four times a day (QID) | ORAL | Status: AC | PRN

## 2010-12-13 MED ORDER — PREDNISONE 10 MG PO TABS: ORAL_TABLET | ORAL | Status: DC

## 2010-12-13 MED ORDER — CYCLOBENZAPRINE HCL 10 MG PO TABS: 10.0000 mg | ORAL_TABLET | Freq: Three times a day (TID) | ORAL | Status: AC | PRN

## 2010-12-13 NOTE — Patient Instructions (Signed)
Be in touch if pain no better in 1-2 weeks and sooner for any weakness or progressive pain.

## 2010-12-13 NOTE — Telephone Encounter (Signed)
Pt was seen today, complete

## 2010-12-13 NOTE — Telephone Encounter (Signed)
See if we can get pt worked in if still has pain today.

## 2010-12-13 NOTE — Progress Notes (Signed)
  Subjective:    Patient ID: Timothy George, male    DOB: [birth year], 60 y.o.   MRN: [id number]  HPI Patient seen with recurrent neck pain. Was doing extremely well following recent prednisone until lifting 60-70 pound box couple days ago. Recurrent left-sided neck pain radiating toward her shoulder and arm. He has numbness and tingling of forearm and arm. No definite weakness. Pain is moderate to severe at times. Trouble sleeping secondary to pain. Has not taken any pain medication. Advil helps slightly. Prior history of C5-C6 fusion   Review of Systems  Constitutional: Negative for fever and chills.  Cardiovascular: Negative for chest pain.  Neurological: Positive for weakness. Negative for headaches.  Hematological: Negative for adenopathy.       Objective:   Physical Exam  Constitutional: He appears well-developed and well-nourished.  HENT:  Head: Normocephalic and atraumatic.  Neck: No thyromegaly present.       Limited range of motion with lateral bending or rotation secondary to pain  Cardiovascular: Normal rate and regular rhythm.   Pulmonary/Chest: Effort normal. No respiratory distress. He has no wheezes. He has no rales.  Musculoskeletal:       No upper extremity muscle atrophy  Lymphadenopathy:    He has no cervical adenopathy.  Neurological:       Full-strength upper extremities with symmetric reflexes          Assessment & Plan:  Cervical neck pain with left sided radiculopathy symptoms but no weakness. Placed on prednisone taper. Vicodin when necessary for pain relief. Refilled Flexeril. Consider MRI if no better in one to 2 weeks

## 2010-12-27 ENCOUNTER — Ambulatory Visit (INDEPENDENT_AMBULATORY_CARE_PROVIDER_SITE_OTHER): Payer: BC Managed Care – PPO | Admitting: Family Medicine

## 2010-12-27 ENCOUNTER — Encounter: Payer: Self-pay | Admitting: Family Medicine

## 2010-12-27 VITALS — BP 150/106 | HR 105 | Temp 98.5°F | Wt 183.0 lb

## 2010-12-27 DIAGNOSIS — M5090 Cervical disc disorder, unspecified, unspecified cervical region: Secondary | ICD-10-CM

## 2010-12-27 MED ORDER — HYDROCODONE-ACETAMINOPHEN 7.5-500 MG PO TABS: 1.0000 | ORAL_TABLET | Freq: Four times a day (QID) | ORAL | Status: AC | PRN

## 2010-12-27 MED ORDER — PREDNISONE (PAK) 10 MG PO TABS: ORAL_TABLET | ORAL | Status: DC

## 2010-12-27 NOTE — Progress Notes (Signed)
  Subjective:    Patient ID: Timothy George, male    DOB: 1951/03/06, 60 y.o.   MRN: [id number]  HPI Here for continued severe pains in the left neck which radiate down the top of the left shoulder and into the left upper arm. This is accompanied by weakness, numbness, and tingling down the left arm and into the hand. This has been going on for about 3 weeks after he lifted a heavy box. He has taken a steroid taper and Motrin with little improvement. Now he is still in a lot of pain.   Review of Systems  Respiratory: Negative.   Cardiovascular: Negative.   Neurological: Positive for weakness and numbness.       Objective:   Physical Exam  Constitutional: He appears well-developed and well-nourished.  Neck:       ROM is quite limited due to pain. He is tender in the left neck and upper back  Neurological: He displays normal reflexes. He exhibits normal muscle tone.          Assessment & Plan:  He has neck pain with radicular symptoms, probably due to recurrent disc issues in the neck. Use more Prednisone and some Vicodin for pain. Set up a cervical spine MRI soon

## 2010-12-28 ENCOUNTER — Encounter: Payer: Self-pay | Admitting: Family Medicine

## 2011-01-08 ENCOUNTER — Ambulatory Visit
Admission: RE | Admit: 2011-01-08 | Discharge: 2011-01-08 | Disposition: A | Discharge status: Home / Self Care | Payer: BC Managed Care – PPO | Source: Ambulatory Visit | Attending: Family Medicine | Admitting: Family Medicine

## 2011-01-08 DIAGNOSIS — M5090 Cervical disc disorder, unspecified, unspecified cervical region: Secondary | ICD-10-CM

## 2011-01-08 MED ORDER — GADOBENATE DIMEGLUMINE 529 MG/ML IV SOLN
17.0000 mL | Freq: Once | INTRAVENOUS | Status: AC | PRN
  Administered 2011-01-08: 17 mL via INTRAVENOUS

## 2011-01-11 ENCOUNTER — Telehealth: Payer: Self-pay | Admitting: Family Medicine

## 2011-01-11 NOTE — Progress Notes (Signed)
Quick Note:  Left a message for pt to return call. ______ 

## 2011-01-11 NOTE — Progress Notes (Signed)
Addended by: Gershon Crane A on: 01/11/2011 12:53 PM   Modules accepted: Orders

## 2011-01-11 NOTE — Telephone Encounter (Signed)
Left voice message with results.

## 2011-01-11 NOTE — Telephone Encounter (Signed)
Message copied by Baldemar Friday on Wed Jan 11, 2011 12:52 PM ------      Message from: Gershon Crane A      Created: Wed Jan 11, 2011 12:45 PM       He has pinched nerves at several levels in his neck, as we suspected. I have referred him to Neurosurgery for this. Camelia Eng will call him

## 2011-01-12 ENCOUNTER — Telehealth: Payer: Self-pay | Admitting: Family Medicine

## 2011-01-12 NOTE — Telephone Encounter (Signed)
Message copied by Baldemar Friday on Thu Jan 12, 2011  3:12 PM ------      Message from: Gershon Crane A      Created: Wed Jan 11, 2011 12:45 PM       He has pinched nerves at several levels in his neck, as we suspected. I have referred him to Neurosurgery for this. Camelia Eng will call him

## 2011-01-12 NOTE — Telephone Encounter (Signed)
Spoke with pt and gave results. 

## 2011-03-09 ENCOUNTER — Emergency Department (HOSPITAL_COMMUNITY)
Admission: EM | Admit: 2011-03-09 | Discharge: 2011-03-09 | Disposition: A | Discharge status: Home / Self Care | Payer: BC Managed Care – PPO | Attending: Emergency Medicine | Admitting: Emergency Medicine

## 2011-03-09 ENCOUNTER — Encounter (HOSPITAL_COMMUNITY): Payer: Self-pay | Admitting: Nurse Practitioner

## 2011-03-09 ENCOUNTER — Emergency Department (HOSPITAL_COMMUNITY): Payer: BC Managed Care – PPO

## 2011-03-09 ENCOUNTER — Other Ambulatory Visit: Payer: Self-pay

## 2011-03-09 DIAGNOSIS — I10 Essential (primary) hypertension: Secondary | ICD-10-CM | POA: Insufficient documentation

## 2011-03-09 DIAGNOSIS — Z79899 Other long term (current) drug therapy: Secondary | ICD-10-CM | POA: Insufficient documentation

## 2011-03-09 DIAGNOSIS — R0789 Other chest pain: Secondary | ICD-10-CM

## 2011-03-09 DIAGNOSIS — K219 Gastro-esophageal reflux disease without esophagitis: Secondary | ICD-10-CM | POA: Insufficient documentation

## 2011-03-09 DIAGNOSIS — Z86718 Personal history of other venous thrombosis and embolism: Secondary | ICD-10-CM | POA: Insufficient documentation

## 2011-03-09 DIAGNOSIS — E785 Hyperlipidemia, unspecified: Secondary | ICD-10-CM | POA: Insufficient documentation

## 2011-03-09 LAB — COMPREHENSIVE METABOLIC PANEL
ALT: 8 U/L (ref 0–53)
AST: 18 U/L (ref 0–37)
Albumin: 4 g/dL (ref 3.5–5.2)
Alkaline Phosphatase: 91 U/L (ref 39–117)
Glucose, Bld: 122 mg/dL — ABNORMAL HIGH (ref 70–99)
Potassium: 3.6 mEq/L (ref 3.5–5.1)
Sodium: 134 mEq/L — ABNORMAL LOW (ref 135–145)
Total Protein: 7.6 g/dL (ref 6.0–8.3)

## 2011-03-09 LAB — CBC
Hemoglobin: 14.2 g/dL (ref 13.0–17.0)
MCHC: 34.7 g/dL (ref 30.0–36.0)

## 2011-03-09 LAB — POCT I-STAT TROPONIN I: Troponin i, poc: 0 ng/mL (ref 0.00–0.08)

## 2011-03-09 MED ORDER — SODIUM CHLORIDE 0.9 % IJ SOLN: 3.0000 mL | INTRAMUSCULAR | Status: DC | PRN

## 2011-03-09 MED ORDER — SODIUM CHLORIDE 0.9 % IV SOLN
250.0000 mL | INTRAVENOUS | Status: DC
  Administered 2011-03-09: 250 mL via INTRAVENOUS

## 2011-03-09 MED ORDER — ASPIRIN 81 MG PO CHEW
324.0000 mg | CHEWABLE_TABLET | Freq: Once | ORAL | Status: AC
  Administered 2011-03-09: 324 mg via ORAL
  Filled 2011-03-09: qty 2

## 2011-03-09 MED ORDER — SODIUM CHLORIDE 0.9 % IJ SOLN
3.0000 mL | Freq: Two times a day (BID) | INTRAMUSCULAR | Status: DC
  Administered 2011-03-09: 3 mL via INTRAVENOUS

## 2011-03-09 MED ORDER — IOHEXOL 350 MG/ML SOLN
100.0000 mL | Freq: Once | INTRAVENOUS | Status: AC | PRN
  Administered 2011-03-09: 100 mL via INTRAVENOUS

## 2011-03-09 NOTE — ED Provider Notes (Addendum)
History     CSN: [id number] Arrival date & time: 03/09/2011  9:03 AM   First MD Initiated Contact with Patient 03/09/11 [id number]      Chief Complaint  Patient presents with  . Chest Pain    (Consider location/radiation/quality/duration/timing/severity/associated sxs/prior treatment) HPI Comments: Pt has been having sharp pains that come and go.  It started about 90 minutes ago.  Pt was getting up to have breakfast.  Patient is a 60 y.o. male presenting with chest pain. The history is provided by the patient.  Chest Pain Duration of episode(s) is 2 seconds. Chest pain occurs intermittently. The severity of the pain is moderate. The quality of the pain is described as sharp and brief. The pain does not radiate. Exacerbated by: not increased by breathing or movement. Pertinent negatives for primary symptoms include no fever, no syncope, no shortness of breath and no cough. Primary symptoms comment: shakiness  Pertinent negatives for associated symptoms include no lower extremity edema.  His past medical history is significant for hypertension and PE.  Pertinent negatives for past medical history include no CAD and no CHF.  His family medical history is significant for CAD in family.  Procedure history is positive for cardiac catheterization and stress echo. Procedure history comments: all normal, checked for chest pain.     Past Medical History  Diagnosis Date  . Chicken pox   . Headache   . GERD (gastroesophageal reflux disease)   . Allergy   . Hyperlipidemia   . Hypertension   . Chronic kidney disease     stones  . Migraines   . Pulmonary embolism 2011  . Neck pain, chronic     Past Surgical History  Procedure Date  . Blood clot     lung  . Spine surgery 1982    fusion at C5-C6  . Cardiac catheterization 2010    Family History  Problem Relation Age of Onset  . Arthritis Other   . Cancer Other     lung  . Hyperlipidemia Other   . Heart disease Other   .  Hypertension Other   . Diabetes Other   . Heart disease Father   . Hypertension Father   . Diabetes Father     History  Substance Use Topics  . Smoking status: Former Smoker -- 1.0 packs/day for 15 years    Types: Cigarettes    Quit date: 11/23/1982  . Smokeless tobacco: Current User    Types: Chew   Comment: still chewing  . Alcohol Use: Yes      Review of Systems  Constitutional: Negative for fever.  Respiratory: Negative for cough and shortness of breath.   Cardiovascular: Positive for chest pain. Negative for syncope.  All other systems reviewed and are negative.    Allergies  Review of patient's allergies indicates no known allergies.  Home Medications   Current Outpatient Rx  Name Route Sig Dispense Refill  . ALPRAZOLAM 0.25 MG PO TABS  1-2 tabs daily as needed     . AMLODIPINE BESYLATE 2.5 MG PO TABS Oral Take 2.5 mg by mouth daily.      Marland Kitchen ESOMEPRAZOLE MAGNESIUM 40 MG PO CPDR Oral Take 40 mg by mouth daily before breakfast.      . HYDROCODONE-ACETAMINOPHEN 10-325 MG PO TABS Oral Take 1 tablet by mouth every 6 (six) hours as needed. For pain     . LISINOPRIL-HYDROCHLOROTHIAZIDE 20-12.5 MG PO TABS Oral Take 1 tablet by mouth daily.      Marland Kitchen  METOPROLOL TARTRATE 25 MG PO TABS Oral Take 25 mg by mouth 2 (two) times daily.      Marland Kitchen VITAMIN B-12 1000 MCG PO TABS Oral Take 1,000 mcg by mouth daily.      Marland Kitchen VITAMIN C 500 MG PO TABS Oral Take 500 mg by mouth daily.        BP 157/103  Pulse 102  Temp(Src) 98 F (36.7 C) (Oral)  Resp 18  Ht 5\' 6"  (1.676 m)  Wt 175 lb (79.379 kg)  BMI 28.25 kg/m2  Physical Exam  Nursing note and vitals reviewed. Constitutional: He appears well-developed and well-nourished. No distress.  HENT:  Head: Normocephalic and atraumatic.  Right Ear: External ear normal.  Left Ear: External ear normal.  Eyes: Conjunctivae are normal. Right eye exhibits no discharge. Left eye exhibits no discharge. No scleral icterus.  Neck: Neck supple. No  tracheal deviation present.  Cardiovascular: Normal rate, regular rhythm and intact distal pulses.   Pulmonary/Chest: Effort normal and breath sounds normal. No stridor. No respiratory distress. He has no wheezes. He has no rales.  Abdominal: Soft. Bowel sounds are normal. He exhibits no distension. There is no tenderness. There is no rebound and no guarding.  Musculoskeletal: He exhibits no edema and no tenderness.  Neurological: He is alert. He has normal strength. No sensory deficit. Cranial nerve deficit:  no gross defecits noted. He exhibits normal muscle tone. He displays no seizure activity. Coordination normal.  Skin: Skin is warm and dry. No rash noted.  Psychiatric: He has a normal mood and affect.    ED Course  Procedures (including critical care time)  Date: 03/09/2011  Rate: 104  Rhythm: sinus tachycardia and premature atrial contractions (PAC)  QRS Axis: normal  Intervals: normal  ST/T Wave abnormalities: normal  Conduction Disutrbances:none  Narrative Interpretation: rate faster since last tracing  Old EKG Reviewed: unchangedexcept rate faster   Labs Reviewed  CBC - Abnormal; Notable for the following:    WBC 11.3 (*)    All other components within normal limits  PROTIME-INR  APTT  POCT I-STAT TROPONIN I  COMPREHENSIVE METABOLIC PANEL  I-STAT TROPONIN I   Ct Angio Chest W/cm &/or Wo Cm  03/09/2011  *RADIOLOGY REPORT*  Clinical Data:  Chest pain, chronic kidney disease, hypertension  CT ANGIOGRAPHY CHEST WITH CONTRAST  Technique:  Multidetector CT imaging of the chest was performed using the standard protocol during bolus administration of intravenous contrast.  Multiplanar CT image reconstructions including MIPs were obtained to evaluate the vascular anatomy.  Contrast: OMNIPAQUE IOHEXOL 350 MG/ML IV SOLN  Comparison:  CT angio chest of 05/16/2010 and chest x-ray of 03/09/2011  Findings:  The pulmonary arteries opacify and there is no evidence of acute  pulmonary embolism.  The thoracic aorta is not as well opacified.  There does appear to be a variation of an aberrant right subclavian artery coursing posterior to the thoracic esophagus.  No mediastinal or hilar adenopathy is seen.  Incidental right upper pole renal cyst is noted.  On the lung window images, minimal haziness is noted in the right mid lung field most likely due to atelectasis and some gas trapping.  No active infiltrate is seen and no effusion is noted. No suspicious lung nodule is seen.  A lower anterior cervical spine fusion plate is present.  There are degenerative changes throughout the mid to lower thoracic spine.  Review of the MIP images confirms the above findings.  IMPRESSION:  1.  No evidence  of acute pulmonary embolism. 2.  Aberrant right subclavian artery. 3.  Probable areas of atelectasis in the right mid lung.  Original Report Authenticated By: Juline Patch, M.D.   Dg Chest Portable 1 View  03/09/2011  *RADIOLOGY REPORT*  Clinical Data: Chest pain, shortness of breath  PORTABLE CHEST - 1 VIEW  Comparison: CT angio chest of 05/16/2010 and chest x-ray of 08/17/2009  Findings: The lungs are clear.  Mediastinal contours appear normal. The heart is within normal limits in size.  No bony abnormality is seen.  A lower anterior cervical spine fusion plate is present.  IMPRESSION: No active lung disease.  Original Report Authenticated By: Juline Patch, M.D.     No diagnosis found.    MDM  August 2010 dc summary HOSPITAL COURSE: The patient was admitted to telemetry unit.  Myocardial infarction was ruled out. He underwent cardiac  catheterization because of his typical chest pain, which failed to show  significant coronary artery disease hence he was discharged home in  satisfactory condition with addition of simvastatin, potassium, and  alprazolam to his current medications, and he will be followed by me in  2-4 weeks with Dr. Orpah Cobb.  Patient has atypical sounding  chest pain. There is a component of him feeling shaky and anxious. He has been feeling better she's been waiting in the ED. I suspect this could have an anxiety component. I doubt acute coronary syndrome based on his symptoms and complaints. I doubt PE with a negative CT injury of the chest. At this point I feel there is no evidence of any acute emergency medical condition. Patient can safely be discharged home to followup with his doctor.   All labs and x-ray results were reviewed. Patient is slight increase in his calcium. At this point I do not think it is clinically significant. It should be rechecked within a couple of weeks by his doctor       Celene Kras, MD 03/09/11 1231  Celene Kras, MD 03/09/11 1610  Celene Kras, MD 05/02/11 [phone]

## 2011-03-09 NOTE — ED Notes (Signed)
C/o cp onset 1 hour ago, pt was eating at onset. States "it feels like my heart is racing." reports mild SOB at onset but has resolved. Neck surgery 2 weeks ago

## 2011-03-09 NOTE — ED Notes (Signed)
Pt states that today while he was about to get going and eat some yogurt he had onset of substernal chest pain that he described as stabbing and severe. Pain does not increase with palpation or deep breathing. Breath sounds are clear and bowel sounds are present. Iv started and labs obtained. Protocols initiated upon arrival to dept. Pt states that he does have hx of pe a few years ago. He also recently had anterior cervical surgery and is still is having pain associated with the surgery.

## 2011-03-14 ENCOUNTER — Ambulatory Visit
Admission: RE | Admit: 2011-03-14 | Discharge: 2011-03-14 | Disposition: A | Discharge status: Home / Self Care | Payer: BC Managed Care – PPO | Source: Ambulatory Visit | Attending: Neurosurgery | Admitting: Neurosurgery

## 2011-03-14 ENCOUNTER — Other Ambulatory Visit: Payer: Self-pay | Admitting: Neurosurgery

## 2011-03-14 DIAGNOSIS — M542 Cervicalgia: Secondary | ICD-10-CM

## 2011-04-13 ENCOUNTER — Other Ambulatory Visit: Payer: Self-pay | Admitting: Neurosurgery

## 2011-04-13 ENCOUNTER — Ambulatory Visit
Admission: RE | Admit: 2011-04-13 | Discharge: 2011-04-13 | Disposition: A | Discharge status: Home / Self Care | Payer: BC Managed Care – PPO | Source: Ambulatory Visit | Attending: Neurosurgery | Admitting: Neurosurgery

## 2011-04-13 DIAGNOSIS — M542 Cervicalgia: Secondary | ICD-10-CM

## 2011-07-07 IMAGING — CR DG CHEST 2V
2 series · 2 of 2 positions shown · non-contrast
Comparison: 11/24/2008

CLINICAL DATA: Chest pain.  Dizziness.  Ex-smoker.

CHEST - 2 VIEW

[w chest pa]
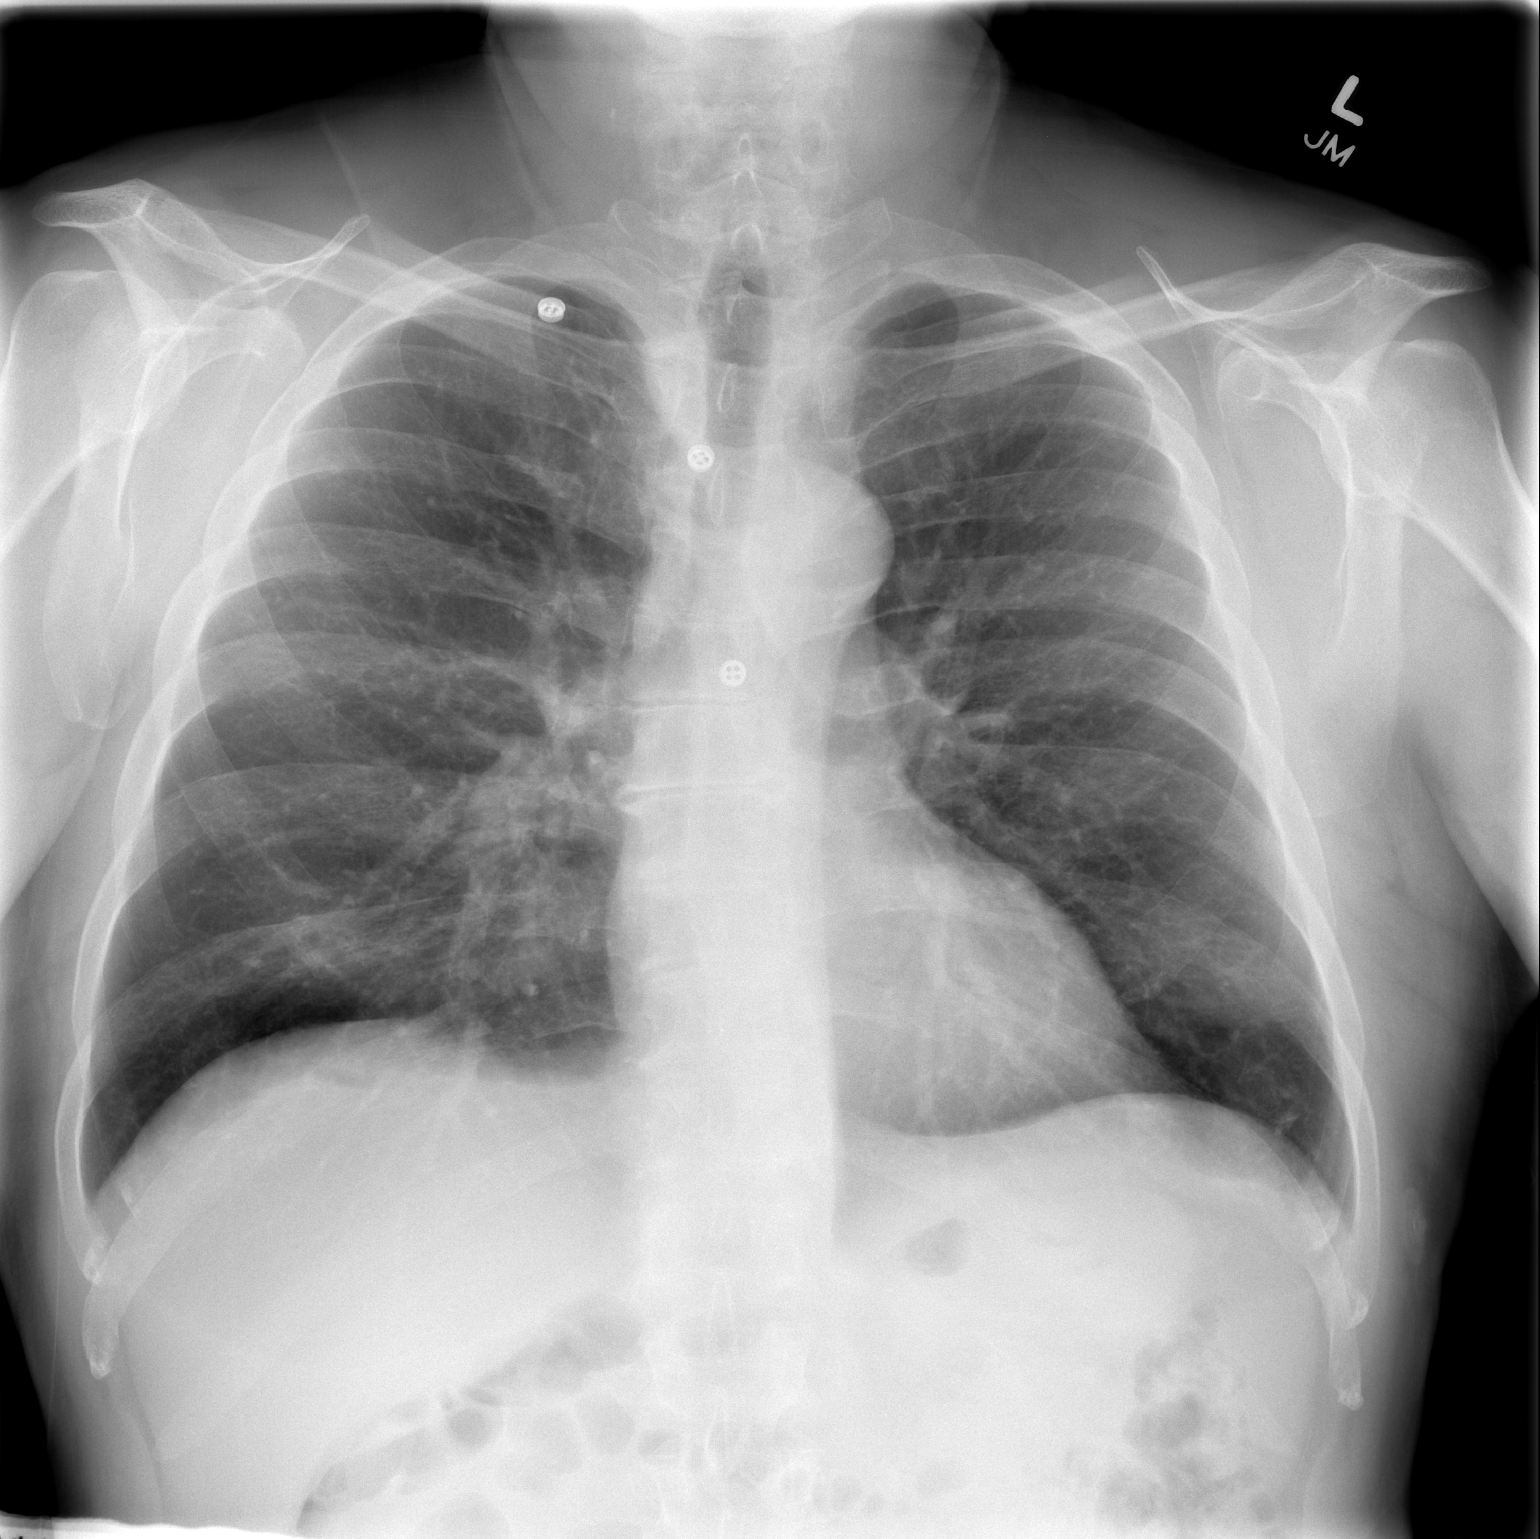

[w chest lat]
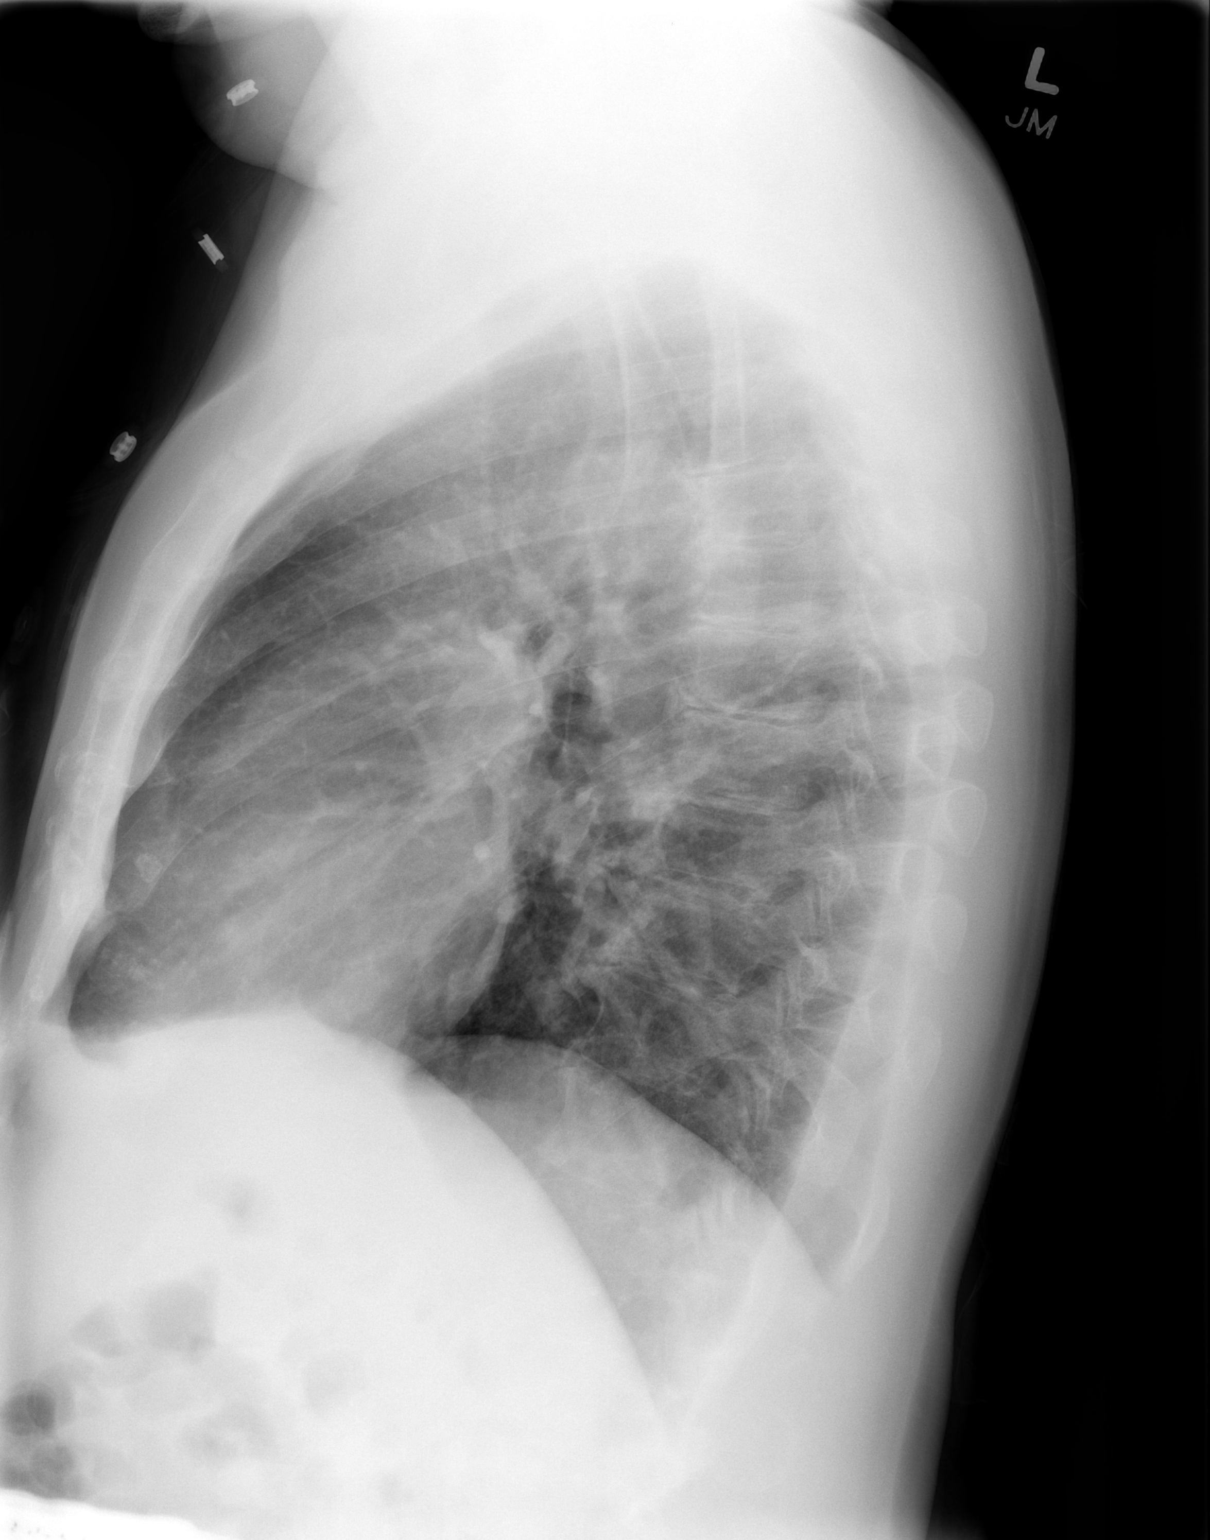

[2 of 2 positions shown; findings below may reference images not displayed]

FINDINGS: Midline trachea.  Normal heart size and mediastinal
contours. No pleural effusion or pneumothorax.  Diffuse
peribronchial thickening.
IMPRESSION: 1.  No acute cardiopulmonary disease.
2.  Mild peribronchial thickening which may relate to chronic
bronchitis or smoking.

## 2013-01-26 IMAGING — CR DG CHEST 1V PORT
1 series · 1 of 1 positions shown · non-contrast
Comparison: CT angio chest of 05/16/2010 and chest x-ray of
08/17/2009

CLINICAL DATA: Chest pain, shortness of breath

PORTABLE CHEST - 1 VIEW

[AP]
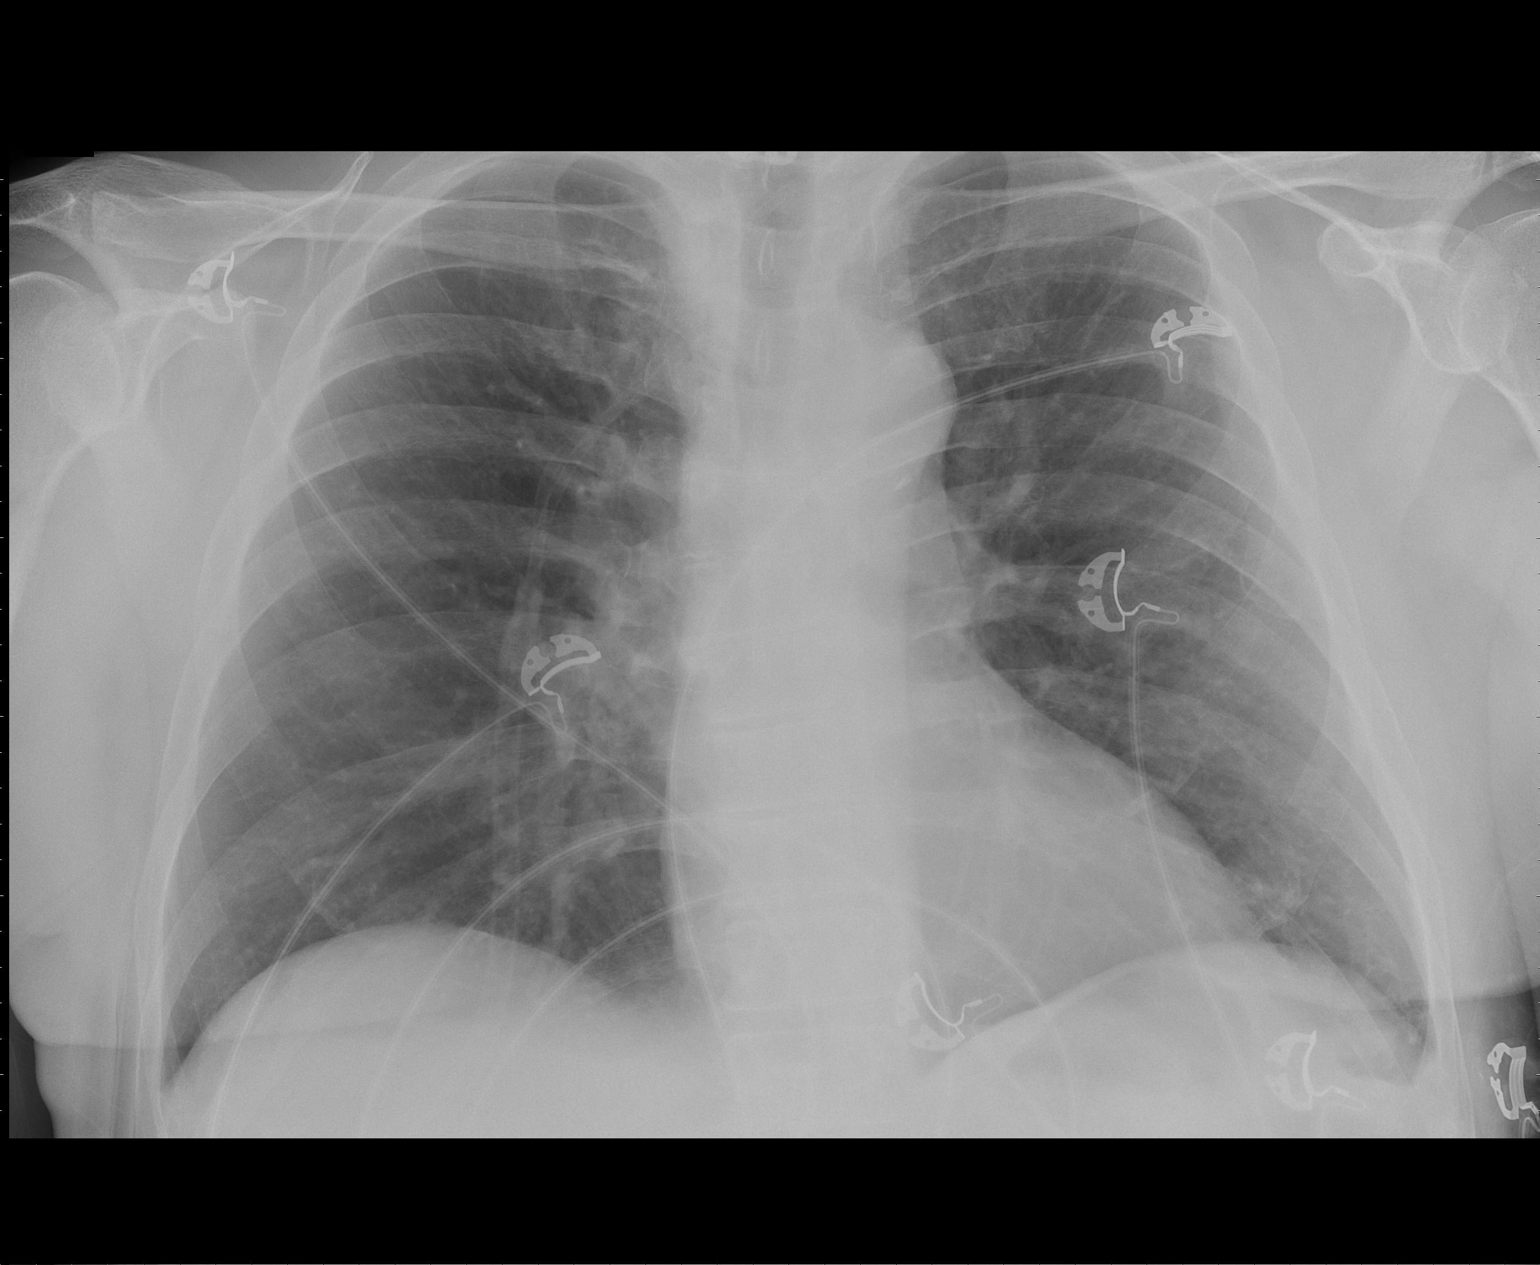

[1 of 1 positions shown; findings below may reference images not displayed]

FINDINGS: The lungs are clear.  Mediastinal contours appear normal.
The heart is within normal limits in size.  No bony abnormality is
seen.  A lower anterior cervical spine fusion plate is present.
IMPRESSION: No active lung disease.

## 2013-01-31 IMAGING — CR DG CERVICAL SPINE 1V
1 series · 1 of 1 positions shown · non-contrast
Comparison: 12/01/2010

CLINICAL DATA: Neck pain.  History of recent cervical fusion.

DG SPINE PORTABLE - 1 VIEW

[w c-spine lat]
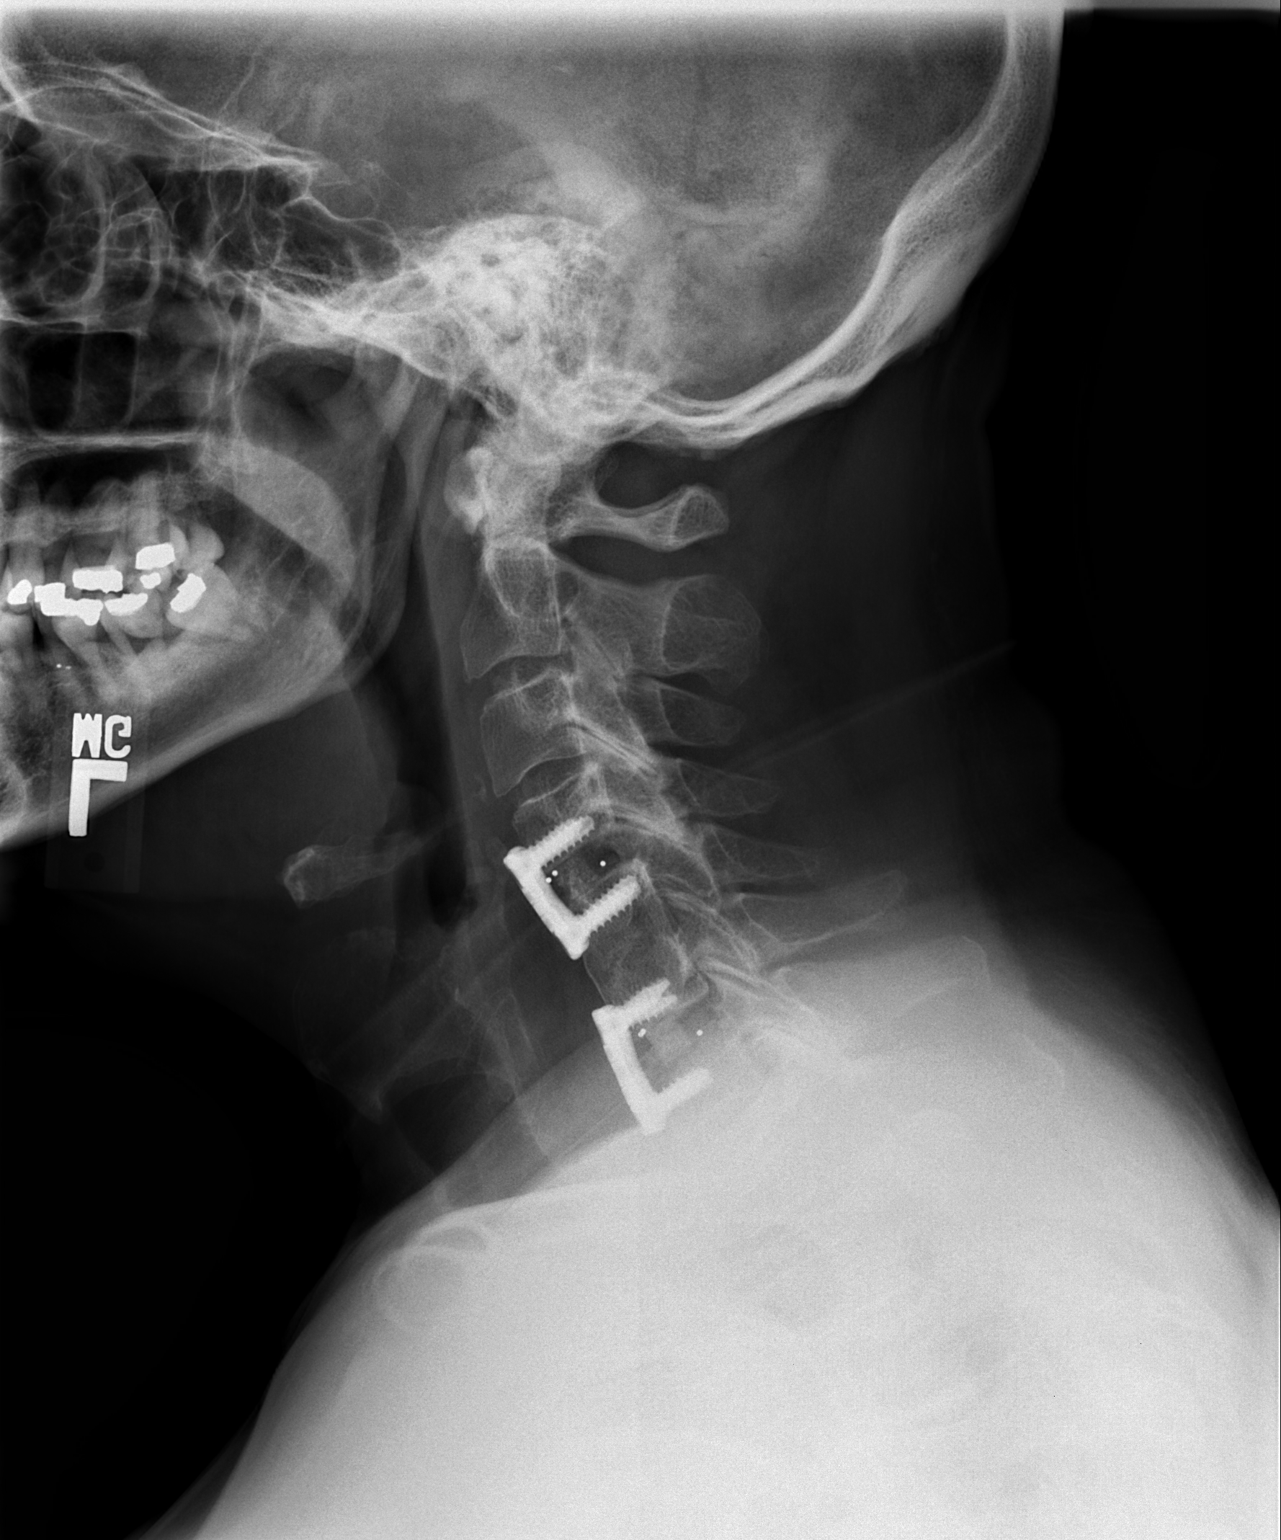

[1 of 1 positions shown; findings below may reference images not displayed]

FINDINGS: Lateral view demonstrates anterior cervical fusion plates
and intervening bone graft material at the C4-5 and C6-7 levels.
Lateral alignment is anatomic.  The C5-6 level is fused.  No soft
tissue swelling.
IMPRESSION: Anatomic alignment in the lateral projection status post ACDF at C4-
5 and C6-7.

## 2013-04-09 ENCOUNTER — Other Ambulatory Visit (HOSPITAL_COMMUNITY): Payer: Self-pay | Admitting: Neurosurgery

## 2013-04-09 DIAGNOSIS — M5412 Radiculopathy, cervical region: Secondary | ICD-10-CM

## 2013-04-11 ENCOUNTER — Ambulatory Visit (HOSPITAL_COMMUNITY): Payer: BC Managed Care – PPO

## 2013-05-02 ENCOUNTER — Ambulatory Visit (HOSPITAL_COMMUNITY)
Admission: RE | Admit: 2013-05-02 | Discharge: 2013-05-02 | Disposition: A | Discharge status: Home / Self Care | Payer: BC Managed Care – PPO | Source: Ambulatory Visit | Attending: Neurosurgery | Admitting: Neurosurgery

## 2013-05-02 ENCOUNTER — Ambulatory Visit (HOSPITAL_COMMUNITY): Payer: BC Managed Care – PPO

## 2013-05-02 DIAGNOSIS — M5412 Radiculopathy, cervical region: Secondary | ICD-10-CM

## 2013-05-02 DIAGNOSIS — M5030 Other cervical disc degeneration, unspecified cervical region: Secondary | ICD-10-CM | POA: Insufficient documentation

## 2013-05-02 DIAGNOSIS — M47812 Spondylosis without myelopathy or radiculopathy, cervical region: Secondary | ICD-10-CM | POA: Insufficient documentation

## 2013-05-02 DIAGNOSIS — Z981 Arthrodesis status: Secondary | ICD-10-CM | POA: Insufficient documentation
